# Patient Record
Sex: Female | Born: 1950 | Race: White | Hispanic: No | Marital: Married | Smoking: Never smoker
Health system: Southern US, Community
[De-identification: ages and names within clinical notes are randomized; demographics above are authoritative.]

## PROBLEM LIST (undated history)

## (undated) DIAGNOSIS — H409 Unspecified glaucoma: Secondary | ICD-10-CM

## (undated) DIAGNOSIS — K589 Irritable bowel syndrome without diarrhea: Secondary | ICD-10-CM

## (undated) HISTORY — PX: ABDOMINAL HYSTERECTOMY: SHX81

## (undated) HISTORY — PX: TONSILLECTOMY: SUR1361

---

## 2011-07-06 ENCOUNTER — Ambulatory Visit: Payer: Self-pay | Admitting: Gastroenterology

## 2011-07-26 ENCOUNTER — Ambulatory Visit: Payer: Self-pay | Admitting: Family Medicine

## 2013-10-07 ENCOUNTER — Ambulatory Visit: Payer: Self-pay | Admitting: Internal Medicine

## 2015-05-28 ENCOUNTER — Encounter: Payer: Self-pay | Admitting: Gynecology

## 2015-05-28 ENCOUNTER — Ambulatory Visit
Admission: EM | Admit: 2015-05-28 | Discharge: 2015-05-28 | Disposition: A | Discharge status: Home / Self Care | Payer: BLUE CROSS/BLUE SHIELD | Attending: Family Medicine | Admitting: Family Medicine

## 2015-05-28 DIAGNOSIS — J0110 Acute frontal sinusitis, unspecified: Secondary | ICD-10-CM

## 2015-05-28 DIAGNOSIS — J0100 Acute maxillary sinusitis, unspecified: Secondary | ICD-10-CM

## 2015-05-28 HISTORY — DX: Unspecified glaucoma: H40.9

## 2015-05-28 HISTORY — DX: Irritable bowel syndrome, unspecified: K58.9

## 2015-05-28 MED ORDER — AMOXICILLIN-POT CLAVULANATE 875-125 MG PO TABS: 1.0000 | ORAL_TABLET | Freq: Two times a day (BID) | ORAL | Status: DC

## 2015-05-28 NOTE — ED Provider Notes (Signed)
[facility]  ____________________________________________  Time seen: Approximately 2:53 PM  I have reviewed the triage vital signs and the nursing notes.   HISTORY  Chief Complaint URI    HPI Kristen Robertson is a 64 y.o. female  presents for the complaints of 2 weeks of runny nose, nasal congestion, sinus pressure. Patient reports gradual onset. Reports frequently blowing her nose and producing very thick greenish mucus. Also feels like the sinus pressure radiates into her teeth and both ears. States that she intermittently feels like she has fluid in her ears.  Reports current sinus discomfort is a 5 out of 10 aching and pressure. Denies pain radiation. Denies headache, dizziness, neck pain, chest pain, shortness of breath or fever. Reports continues to eat and drink well. Denies recent antibiotic use.   Past Medical History  Diagnosis Date  . Glaucoma   . IBS (irritable bowel syndrome)     There are no active problems to display for this patient.   Past Surgical History  Procedure Laterality Date  . Tonsillectomy    . Abdominal hysterectomy      Current Outpatient Rx  Name  Route  Sig  Dispense  Refill  . imipramine (TOFRANIL) 50 MG tablet   Oral   Take 50 mg by mouth at bedtime.           Allergies Aspirin  No family history on file.  Social History Social History  Substance Use Topics  . Smoking status: Never Smoker   . Smokeless tobacco: None  . Alcohol Use: Yes    Review of Systems Constitutional: No fever/chills Eyes: No visual changes. ENT: No sore throat. Positive runny nose, nasal congestion, sinus discomfort. Positive ear discomfort. Cardiovascular: Denies chest pain. Respiratory: Denies shortness of breath. Gastrointestinal: No abdominal pain.  No nausea, no vomiting.  No diarrhea.  No constipation. Genitourinary: Negative for dysuria. Musculoskeletal: Negative for back pain. Skin: Negative for rash. Neurological: Negative  for headaches, focal weakness or numbness.  10-point ROS otherwise negative.  ____________________________________________   PHYSICAL EXAM:  VITAL SIGNS: ED Triage Vitals  Enc Vitals Group     BP 05/28/15 1348 136/54 mmHg     Pulse Rate 05/28/15 1348 91     Resp 05/28/15 1348 16     Temp 05/28/15 1348 98.6 F (37 C)     Temp Source 05/28/15 1348 Oral     SpO2 05/28/15 1348 100 %     Weight 05/28/15 1348 147 lb (66.679 kg)     Height 05/28/15 1348 5\' 2"  (1.575 m)     Head Cir --      Peak Flow --      Pain Score 05/28/15 1347 0     Pain Loc --      Pain Edu? --      Excl. in [place]? --     Constitutional: Alert and oriented. Well appearing and in no acute distress. Eyes: Conjunctivae are normal. PERRL. EOMI. Head: Atraumatic. Moderate tenderness to palpation bilateral maxillary and bilateral frontal sinuses. No swelling. No erythema.  Ears: no erythema, normal TMs bilaterally. No exudate or drainage.   Nose: nasal congestion, purulent nasal drainage, nasal turbinate erythema.   Mouth/Throat: Mucous membranes are moist.  Oropharynx non-erythematous. No exudate. Tonsils surgically absent. Neck: No stridor.  No cervical spine tenderness to palpation. Hematological/Lymphatic/Immunilogical: No cervical lymphadenopathy. Cardiovascular: Normal rate, regular rhythm. Grossly normal heart sounds.  Good peripheral circulation. Respiratory: Normal respiratory effort.  No retractions. Lungs CTAB. No wheezes,  rales or rhonchi. Gastrointestinal: Soft and nontender. No distention. Normal Bowel sounds.   Musculoskeletal: No lower or upper extremity tenderness nor edema.  No joint effusions. Bilateral pedal pulses equal and easily palpated.  Neurologic:  Normal speech and language. No gross focal neurologic deficits are appreciated. No gait instability. Skin:  Skin is warm, dry and intact. No rash noted. Psychiatric: Mood and affect are normal. Speech and behavior are  normal.  ____________________________________________   LABS (all labs ordered are listed, but only abnormal results are displayed)  Labs Reviewed - No data to display   INITIAL IMPRESSION / ASSESSMENT AND PLAN / ED COURSE  Pertinent labs & imaging results that were available during my care of the patient were reviewed by me and considered in my medical decision making (see chart for details).  Very well-appearing patient. No acute distress. Presents for the complaints of 2 weeks of runny nose, nasal congestion, sinus pressure. Lungs clear throughout. Will treat sinusitis with oral Augmentin and supportive treatments including rest, fluids, prn otc ibuprofen or tylenol. Follow-up with primary care physician this week as needed.  Discussed follow up with Primary care physician this week. Discussed follow up and return parameters including no resolution or any worsening concerns. Patient verbalized understanding and agreed to plan.   ____________________________________________   FINAL CLINICAL IMPRESSION(S) / ED DIAGNOSES  Final diagnoses:  Acute maxillary sinusitis, recurrence not specified  Acute frontal sinusitis, recurrence not specified       Marylene Land, NP 05/28/15 1513

## 2015-05-28 NOTE — Discharge Instructions (Signed)
Take medication as prescribed. Eat and drink well. Take over the counter tylenol or ibuprofen as needed.   Follow up with your primary care physician this week as needed. Return to Urgent care for new or worsening concerns.   Sinusitis, Adult Sinusitis is redness, soreness, and inflammation of the paranasal sinuses. Paranasal sinuses are air pockets within the bones of your face. They are located beneath your eyes, in the middle of your forehead, and above your eyes. In healthy paranasal sinuses, mucus is able to drain out, and air is able to circulate through them by way of your nose. However, when your paranasal sinuses are inflamed, mucus and air can become trapped. This can allow bacteria and other germs to grow and cause infection. Sinusitis can develop quickly and last only a short time (acute) or continue over a long period (chronic). Sinusitis that lasts for more than 12 weeks is considered chronic. CAUSES Causes of sinusitis include:  Allergies.  Structural abnormalities, such as displacement of the cartilage that separates your nostrils (deviated septum), which can decrease the air flow through your nose and sinuses and affect sinus drainage.  Functional abnormalities, such as when the small hairs (cilia) that line your sinuses and help remove mucus do not work properly or are not present. SIGNS AND SYMPTOMS Symptoms of acute and chronic sinusitis are the same. The primary symptoms are pain and pressure around the affected sinuses. Other symptoms include:  Upper toothache.  Earache.  Headache.  Bad breath.  Decreased sense of smell and taste.  A cough, which worsens when you are lying flat.  Fatigue.  Fever.  Thick drainage from your nose, which often is green and may contain pus (purulent).  Swelling and warmth over the affected sinuses. DIAGNOSIS Your health care provider will perform a physical exam. During your exam, your health care provider may perform any of the  following to help determine if you have acute sinusitis or chronic sinusitis:  Look in your nose for signs of abnormal growths in your nostrils (nasal polyps).  Tap over the affected sinus to check for signs of infection.  View the inside of your sinuses using an imaging device that has a light attached (endoscope). If your health care provider suspects that you have chronic sinusitis, one or more of the following tests may be recommended:  Allergy tests.  Nasal culture. A sample of mucus is taken from your nose, sent to a lab, and screened for bacteria.  Nasal cytology. A sample of mucus is taken from your nose and examined by your health care provider to determine if your sinusitis is related to an allergy. TREATMENT Most cases of acute sinusitis are related to a viral infection and will resolve on their own within 10 days. Sometimes, medicines are prescribed to help relieve symptoms of both acute and chronic sinusitis. These may include pain medicines, decongestants, nasal steroid sprays, or saline sprays. However, for sinusitis related to a bacterial infection, your health care provider will prescribe antibiotic medicines. These are medicines that will help kill the bacteria causing the infection. Rarely, sinusitis is caused by a fungal infection. In these cases, your health care provider will prescribe antifungal medicine. For some cases of chronic sinusitis, surgery is needed. Generally, these are cases in which sinusitis recurs more than 3 times per year, despite other treatments. HOME CARE INSTRUCTIONS  Drink plenty of water. Water helps thin the mucus so your sinuses can drain more easily.  Use a humidifier.  Inhale steam 3-4  times a day (for example, sit in the bathroom with the shower running).  Apply a warm, moist washcloth to your face 3-4 times a day, or as directed by your health care provider.  Use saline nasal sprays to help moisten and clean your sinuses.  Take  medicines only as directed by your health care provider.  If you were prescribed either an antibiotic or antifungal medicine, finish it all even if you start to feel better. SEEK IMMEDIATE MEDICAL CARE IF:  You have increasing pain or severe headaches.  You have nausea, vomiting, or drowsiness.  You have swelling around your face.  You have vision problems.  You have a stiff neck.  You have difficulty breathing.   This information is not intended to replace advice given to you by your health care provider. Make sure you discuss any questions you have with your health care provider.   Document Released: 06/18/2005 Document Revised: 07/09/2014 Document Reviewed: 07/03/2011 Elsevier Interactive Patient Education 2016 Elsevier Inc[employer]  Sinus Rinse WHAT IS A SINUS RINSE? A sinus rinse is a simple home treatment that is used to rinse your sinuses with a sterile mixture of salt and water (saline solution). Sinuses are air-filled spaces in your skull behind the bones of your face and forehead that open into your nasal cavity. You will use the following:  Saline solution.  Neti pot or spray bottle. This releases the saline solution into your nose and through your sinuses. Neti pots and spray bottles can be purchased at [employer], a health food store, or online. WHEN WOULD I DO A SINUS RINSE? A sinus rinse can help to clear mucus, dirt, dust, or pollen from the nasal cavity. You may do a sinus rinse when you have a cold, a virus, nasal allergy symptoms, a sinus infection, or stuffiness in the nose or sinuses. If you are considering a sinus rinse:  Ask your child's health care provider before performing a sinus rinse on your child.  Do not do a sinus rinse if you have had ear or nasal surgery, ear infection, or blocked ears. HOW DO I DO A SINUS RINSE?  Wash your hands.  Disinfect your device according to the directions provided and then dry it.  Use the solution that comes  with your device or one that is sold separately in stores. Follow the mixing directions on the package.  Fill your device with the amount of saline solution as directed by the device instructions.  Stand over a sink and tilt your head sideways over the sink.  Place the spout of the device in your upper nostril (the one closer to the ceiling).  Gently pour or squeeze the saline solution into the nasal cavity. The liquid should drain to the lower nostril if you are not overly congested.  Gently blow your nose. Blowing too hard may cause ear pain.  Repeat in the other nostril.  Clean and rinse your device with clean water and then air-dry it. ARE THERE RISKS OF A SINUS RINSE?  Sinus rinse is generally very safe and effective. However, there are a few risks, which include:   A burning sensation in the sinuses. This may happen if you do not make the saline solution as directed. Make sure to follow all directions when making the saline solution.  Infection from contaminated water. This is rare, but possible.  Nasal irritation.   This information is not intended to replace advice given to you by your health care provider. Make  sure you discuss any questions you have with your health care provider.   Document Released: 01/13/2014 Document Reviewed: 01/13/2014 Elsevier Interactive Patient Education [employer].

## 2015-05-28 NOTE — ED Notes (Signed)
Patient c/o cough / nasal congestion.

## 2016-03-15 ENCOUNTER — Ambulatory Visit (INDEPENDENT_AMBULATORY_CARE_PROVIDER_SITE_OTHER): Payer: BLUE CROSS/BLUE SHIELD

## 2016-03-15 ENCOUNTER — Encounter: Payer: Self-pay | Admitting: Emergency Medicine

## 2016-03-15 ENCOUNTER — Ambulatory Visit
Admission: EM | Admit: 2016-03-15 | Discharge: 2016-03-15 | Disposition: A | Discharge status: Home / Self Care | Payer: BLUE CROSS/BLUE SHIELD | Attending: Family Medicine | Admitting: Family Medicine

## 2016-03-15 DIAGNOSIS — M25511 Pain in right shoulder: Secondary | ICD-10-CM

## 2016-03-15 DIAGNOSIS — M7521 Bicipital tendinitis, right shoulder: Secondary | ICD-10-CM

## 2016-03-15 MED ORDER — TRAMADOL HCL 50 MG PO TABS
50.0000 mg | ORAL_TABLET | Freq: Four times a day (QID) | ORAL | 0 refills | Status: AC | PRN
Start: 2016-03-15 — End: ?

## 2016-03-15 NOTE — ED Triage Notes (Signed)
Patient c/o pain on the right side of her neck and in her right arm that started several weeks ago.  Patient denies chest pain or SOB.

## 2016-03-15 NOTE — ED Provider Notes (Signed)
[facility]    CSN: [id number] Arrival date & time: 03/15/16  [id number]  First Provider Contact:  None       History   Chief Complaint Chief Complaint  Patient presents with  . Neck Pain  . Arm Pain    HPI Kristen Robertson is a 65 y.o. female.   Patient is here because of pain in the right shoulder. Unfortunately she cannot really delineate when the right shoulder pain started we know this been going on for least a month. She still driving with the right arm but she does admit that they use for right arm is very limited. She denies any injury to the right arm or shoulder. One time she was worried about her heart but realizes that there is nothing to do with the left side and that she's had no chest pain or shortness of breath. She does state that she has an allergy to aspirin because that allergies to aspirin she doesn't take any type of ibuprofen and other anti-inflammatory. She states that her doctor when she lived [place] told her not to take any of those medicines because for allergies to aspirin Kristen Robertson she does. She has just taken Tylenol for the pain which is helped to some degree. She notices difficulty lifting the arm above her head so she is concerned and came in to be seen. She does not have a primary care physician in the area since moving from [place] states she just comes here when she is cold or congestion. She does have a history of elevated cholesterol which was done by her husband's wellness plan but she's not had any medication for that this time. She also has a history of glaucoma and IBS. Previous surgeries hysterectomy and tonsillectomy and she does not smoke. No pertinent family medical history pertaining to her relevant to this visit   She did have some neck pain states that, this and was felt that more especially neck pain by her right lower jaw which is gotten better. She was concerned whether the right jaw pain caused her to have the right shoulder  pain   The history is provided by the patient. The history is limited by a language barrier. No language interpreter was used.  Neck Pain  Pain location:  R side Quality:  Aching Pain radiates to:  Does not radiate Pain severity:  Mild Progression:  Improving Context: not fall, not jumping from heights, not lifting a heavy object, not MCA, not MVC, not pedestrian accident and not recent injury   Worsened by:  Nothing Ineffective treatments:  None tried Risk factors: no hx of head and neck radiation, no hx of osteoporosis, no hx of spinal trauma, no recent epidural, no recent head injury and no recurrent falls   Arm Pain   Shoulder Pain  Location:  Shoulder Pain details:    Quality:  Aching and shooting   Radiates to:  R shoulder   Severity:  Moderate   Onset quality:  Sudden   Timing:  Constant Handedness:  Right-handed Dislocation: no   Foreign body present:  No foreign bodies Prior injury to area:  Yes Relieved by:  Arthritis medications Worsened by:  Nothing Associated symptoms: neck pain     Past Medical History:  Diagnosis Date  . Glaucoma   . IBS (irritable bowel syndrome)     There are no active problems to display for this patient.   Past Surgical History:  Procedure Laterality Date  . ABDOMINAL  HYSTERECTOMY    . TONSILLECTOMY      OB History    No data available       Home Medications    Prior to Admission medications   Medication Sig Start Date End Date Taking? Authorizing Provider  imipramine (TOFRANIL) 50 MG tablet Take 50 mg by mouth at bedtime.    Historical Provider, MD  traMADol (ULTRAM) 50 MG tablet Take 1 tablet (50 mg total) by mouth every 6 (six) hours as needed. 03/15/16   Frederich Cha, MD    Family History History reviewed. No pertinent family history.  Social History Social History  Substance Use Topics  . Smoking status: Never Smoker  . Smokeless tobacco: Never Used  . Alcohol use Yes     Allergies   Aspirin   Review  of Systems Review of Systems  Musculoskeletal: Positive for myalgias and neck pain.  All other systems reviewed and are negative.    Physical Exam Triage Vital Signs ED Triage Vitals  Enc Vitals Group     BP 03/15/16 0821 (!) 141/63     Pulse Rate 03/15/16 0821 78     Resp 03/15/16 0821 16     Temp 03/15/16 0821 97.4 F (36.3 C)     Temp Source 03/15/16 0821 Tympanic     SpO2 03/15/16 0821 100 %     Weight 03/15/16 0822 142 lb (64.4 kg)     Height 03/15/16 0822 5\' 2"  (1.575 m)     Head Circumference --      Peak Flow --      Pain Score 03/15/16 0824 3     Pain Loc --      Pain Edu? --      Excl. in [place]? --    No data found.   Updated Vital Signs BP (!) 141/63 (BP Location: Left Arm)   Pulse 78   Temp 97.4 F (36.3 C) (Tympanic)   Resp 16   Ht 5\' 2"  (1.575 m)   Wt 142 lb (64.4 kg)   SpO2 100%   BMI 25.97 kg/m   Visual Acuity Right Eye Distance:   Left Eye Distance:   Bilateral Distance:    Right Eye Near:   Left Eye Near:    Bilateral Near:     Physical Exam  Constitutional: She is oriented to person, place, and time. She appears well-developed and well-nourished. No distress.  HENT:  Head: Normocephalic and atraumatic.  Eyes: Pupils are equal, round, and reactive to light.  Neck: Normal range of motion. Neck supple.  Pulmonary/Chest: Effort normal.  Musculoskeletal: She exhibits tenderness.       Right shoulder: She exhibits decreased range of motion, tenderness, bony tenderness and pain. She exhibits no deformity, no spasm and normal strength.       Arms: Patient has tenderness over her right by some tendon consistent with a biceps tendinitis mother concern is she is able to raise the arm above her head slowly she is also able to resist me with the empty beer can test maneuver however she has limited range of motion of the shoulder moving her arm back behind her back indicating some shoulder lotion problem or early freezing of the shoulder    Lymphadenopathy:    She has no cervical adenopathy.  Neurological: She is alert and oriented to person, place, and time.  Skin: Skin is warm. She is diaphoretic. No erythema.  Psychiatric: She has a normal mood and affect.  Vitals reviewed.  UC Treatments / Results  Labs (all labs ordered are listed, but only abnormal results are displayed) Labs Reviewed - No data to display  EKG  EKG Interpretation None       Radiology Dg Shoulder Right  Result Date: 03/15/2016 CLINICAL DATA:  Right shoulder pain for 6 months. No reported injury. EXAM: RIGHT SHOULDER - 2+ VIEW COMPARISON:  None. FINDINGS: There is no evidence of fracture or dislocation. There is no evidence of arthropathy or other focal bone abnormality. Soft tissues are unremarkable. IMPRESSION: Negative. Electronically Signed   By: Ilona Sorrel M.D.   On: 03/15/2016 09:15    Procedures Procedures (including critical care time)  Medications Ordered in UC Medications - No data to display   Initial Impression / Assessment and Plan / UC Course  I have reviewed the triage vital signs and the nursing notes.  Pertinent labs & imaging results that were available during my care of the patient were reviewed by me and considered in my medical decision making (see chart for details).  Clinical Course    As expected showed x-rays negative without trauma V unlikely to have a fracture dislocation. Feel this is consistent with diagnosis of bicipital tendinitis and early shoulder freezing. Normally we'll place her on anti-inflammatories but because of her fear of anti-inflammatories of any type we'll hold off on that. We'll recommend referral to orthopedic for evaluation and possible steroid injection in the biceps tendon to help relieve [place] pain also giving her instructions verbally about using exercise band attached the door to try to keep the shoulder from freezing  Final Clinical Impressions(s) / UC Diagnoses   Final  diagnoses:  Biceps tendonitis on right  Right shoulder pain   As patient being discharged since she can't take anti-inflammatories and she states that she wants something stronger than the Tylenol she's been taking a prescription of tramadol was given to her until she can see the orthopedic and had the shoulder injected New Prescriptions New Prescriptions   TRAMADOL (ULTRAM) 50 MG TABLET    Take 1 tablet (50 mg total) by mouth every 6 (six) hours as needed.     Frederich Cha, MD 03/15/16 [id number]

## 2016-06-12 DIAGNOSIS — H401132 Primary open-angle glaucoma, bilateral, moderate stage: Secondary | ICD-10-CM | POA: Diagnosis not present

## 2016-06-12 DIAGNOSIS — H5213 Myopia, bilateral: Secondary | ICD-10-CM | POA: Diagnosis not present

## 2016-06-15 ENCOUNTER — Ambulatory Visit
Admission: EM | Admit: 2016-06-15 | Discharge: 2016-06-15 | Disposition: A | Discharge status: Home / Self Care | Payer: Medicare HMO | Attending: Family Medicine | Admitting: Family Medicine

## 2016-06-15 ENCOUNTER — Encounter: Payer: Self-pay | Admitting: Emergency Medicine

## 2016-06-15 DIAGNOSIS — J0100 Acute maxillary sinusitis, unspecified: Secondary | ICD-10-CM

## 2016-06-15 MED ORDER — BENZONATATE 100 MG PO CAPS: 100.0000 mg | ORAL_CAPSULE | Freq: Three times a day (TID) | ORAL | 0 refills | Status: AC | PRN

## 2016-06-15 MED ORDER — AMOXICILLIN-POT CLAVULANATE 875-125 MG PO TABS: 1.0000 | ORAL_TABLET | Freq: Two times a day (BID) | ORAL | 0 refills | Status: DC

## 2016-06-15 NOTE — Discharge Instructions (Signed)
Take medication as prescribed. Rest. Drink plenty of fluids. Monitor blood pressure at home as discussed.   Follow up with your primary care physician this week as needed. Return to Urgent care for new or worsening concerns.

## 2016-06-15 NOTE — ED Triage Notes (Signed)
Patient c/o sinus pain and pressure for the past 3 weeks.  Patient denies fevers.

## 2016-06-15 NOTE — ED Provider Notes (Signed)
[facility] ____________________________________________  Time seen: Approximately 10:18 AM  I have reviewed the triage vital signs and the nursing notes.   HISTORY  Chief Complaint Facial Pain   HPI Kristen Robertson is a 65 y.o. female presents with complaints of 3 weeks of runny nose, nasal congestion and sinus drainage. Patient reports sinus pressure describes as fullness in her cheeks. Patient reports taking home Zyrtec and over-the-counter congestion medications help but no resolution. States some cough, primarily at night with associated postnasal drainage. States initially felt like it started out as her seasonal allergies but progressed. Denies and sick contacts. Patient poor she is also been using nasal saline drops and using Nettie pot with return of large amount of mucus. States plan her nose frequently and thick mucus present. States occasional blood-tinged mucus when blowing her nose, denies any other nasal bleeding or any other abnormal bleeding.  Denies chest pain, shortness of breath, abdominal pain, dysuria, neck pain, back pain, extremity pain or extremity swelling. Denies recent sickness or recent antibiotic use.   No LMP recorded. Patient has had a hysterectomy.    Past Medical History:  Diagnosis Date  . Glaucoma   . IBS (irritable bowel syndrome)     There are no active problems to display for this patient.   Past Surgical History:  Procedure Laterality Date  . ABDOMINAL HYSTERECTOMY    . TONSILLECTOMY      No current facility-administered medications for this encounter.   Current Outpatient Prescriptions:  .  amoxicillin-clavulanate (AUGMENTIN) 875-125 MG tablet, Take 1 tablet by mouth every 12 (twelve) hours., Disp: 20 tablet, Rfl: 0 .  benzonatate (TESSALON PERLES) 100 MG capsule, Take 1 capsule (100 mg total) by mouth 3 (three) times daily as needed for cough., Disp: 15 capsule, Rfl: 0 .  imipramine (TOFRANIL) 50 MG tablet, Take 50 mg  by mouth at bedtime., Disp: , Rfl:  .  traMADol (ULTRAM) 50 MG tablet, Take 1 tablet (50 mg total) by mouth every 6 (six) hours as needed., Disp: 15 tablet, Rfl: 0  Allergies Aspirin  History reviewed. No pertinent family history.  Social History Social History  Substance Use Topics  . Smoking status: Never Smoker  . Smokeless tobacco: Never Used  . Alcohol use Yes    Review of Systems Constitutional: No fever/chills Eyes: No visual changes. ENT: No sore throat. As above.  Cardiovascular: Denies chest pain. Respiratory: Denies shortness of breath. Gastrointestinal: No abdominal pain.  No nausea, no vomiting.  No diarrhea.  No constipation. Genitourinary: Negative for dysuria. Musculoskeletal: Negative for back pain. Skin: Negative for rash. Neurological: Negative for headaches, focal weakness or numbness.  10-point ROS otherwise negative.  ____________________________________________   PHYSICAL EXAM:  VITAL SIGNS: ED Triage Vitals  Enc Vitals Group     BP 06/15/16 1009 (!) 162/89     Pulse Rate 06/15/16 1009 99     Resp 06/15/16 1009 16     Temp 06/15/16 1009 98.7 F (37.1 C)     Temp Source 06/15/16 1009 Oral     SpO2 06/15/16 1009 100 %     Weight 06/15/16 1008 150 lb (68 kg)     Height 06/15/16 1008 5\' 2"  (1.575 m)     Head Circumference --      Peak Flow --      Pain Score 06/15/16 1009 4     Pain Loc --      Pain Edu? --      Excl. in [place]? --  Today's Vitals   06/15/16 1008 06/15/16 1009 06/15/16 1036 06/15/16 1037  BP:  (!) 162/89  (!) 156/63  Pulse:  99  97  Resp:  16    Temp:  98.7 F (37.1 C)    TempSrc:  Oral    SpO2:  100%    Weight: 150 lb (68 kg)     Height: 5\' 2"  (1.575 m)     PainSc:  4  4     Constitutional: Alert and oriented. Well appearing and in no acute distress. Eyes: Conjunctivae are normal. PERRL. EOMI. Head: Atraumatic.Mild to moderate tenderness to palpation bilateral maxillary sinuses, no frontal sinus tenderness to  palpation. No swelling. No erythema.   Ears: no erythema, normal TMs bilaterally.   Nose: nasal congestion with bilateral nasal turbinate erythema and edema. Left nare minimal dried blood present, no other dried blood noted, no active bleeding.  Mouth/Throat: Mucous membranes are moist.  Oropharynx non-erythematous.No tonsillar swelling or exudate.  Neck: No stridor.  No cervical spine tenderness to palpation. Hematological/Lymphatic/Immunilogical: No cervical lymphadenopathy. Cardiovascular: Normal rate, regular rhythm. Grossly normal heart sounds.  Good peripheral circulation. Respiratory: Normal respiratory effort.  No retractions. Lungs CTAB. No wheezes, rales or rhonchi. Good air movement.  Gastrointestinal: Soft and nontender. No distention. Normal Bowel sounds. No CVA tenderness. Musculoskeletal: No lower or upper extremity tenderness nor edema.   No cervical, thoracic or lumbar tenderness to palpation.  Neurologic:  Normal speech and language. No gait instability. Skin:  Skin is warm, dry and intact. No rash noted. Psychiatric: Mood and affect are normal. Speech and behavior are normal.  ___________________________________________   LABS (all labs ordered are listed, but only abnormal results are displayed)  Labs Reviewed - No data to display  PROCEDURES Procedures   INITIAL IMPRESSION / ASSESSMENT AND PLAN / ED COURSE  Pertinent labs & imaging results that were available during my care of the patient were reviewed by me and considered in my medical decision making (see chart for details).  Well-appearing patient. No acute distress. 3 weeks of nasal congestion and sinus pressure. Suspect maxillary sinusitis. Encouraged supportive care. Will treat patient with oral Augmentin and when necessary Tessalon Perles. Encouraged rest, fluids and PCP follow-up as needed.  Discussed in detail with patient regarding monitoring blood pressure at home, discuss stopping over-the-counter  multiple agent cough and congestion medications as that could contribute. Patient states she will monitor blood pressure at home and follow up with primary care physician this week.  Discussed follow up with Primary care physician this week. Discussed follow up and return parameters including no resolution or any worsening concerns. Patient verbalized understanding and agreed to plan.   ____________________________________________   FINAL CLINICAL IMPRESSION(S) / ED DIAGNOSES  Final diagnoses:  Acute maxillary sinusitis, recurrence not specified     Discharge Medication List as of 06/15/2016 10:33 AM    START taking these medications   Details  amoxicillin-clavulanate (AUGMENTIN) 875-125 MG tablet Take 1 tablet by mouth every 12 (twelve) hours., Starting Fri 06/15/2016, Normal    benzonatate (TESSALON PERLES) 100 MG capsule Take 1 capsule (100 mg total) by mouth 3 (three) times daily as needed for cough., Starting Fri 06/15/2016, Normal        Note: This dictation was prepared with Dragon dictation along with smaller phrase technology. Any transcriptional errors that result from this process are unintentional.    Clinical Course       Marylene Land, NP 06/15/16 1058    Marylene Land, NP 06/15/16  1059  

## 2016-07-13 DIAGNOSIS — H409 Unspecified glaucoma: Secondary | ICD-10-CM | POA: Diagnosis not present

## 2016-07-13 DIAGNOSIS — J309 Allergic rhinitis, unspecified: Secondary | ICD-10-CM | POA: Diagnosis not present

## 2016-07-13 DIAGNOSIS — Z0000 Encounter for general adult medical examination without abnormal findings: Secondary | ICD-10-CM | POA: Diagnosis not present

## 2016-07-13 DIAGNOSIS — Z79899 Other long term (current) drug therapy: Secondary | ICD-10-CM | POA: Diagnosis not present

## 2016-07-13 DIAGNOSIS — Z6827 Body mass index (BMI) 27.0-27.9, adult: Secondary | ICD-10-CM | POA: Diagnosis not present

## 2016-07-13 DIAGNOSIS — K580 Irritable bowel syndrome with diarrhea: Secondary | ICD-10-CM | POA: Diagnosis not present

## 2016-08-03 DIAGNOSIS — H26492 Other secondary cataract, left eye: Secondary | ICD-10-CM | POA: Diagnosis not present

## 2016-08-03 DIAGNOSIS — H401122 Primary open-angle glaucoma, left eye, moderate stage: Secondary | ICD-10-CM | POA: Diagnosis not present

## 2016-08-03 DIAGNOSIS — Z9889 Other specified postprocedural states: Secondary | ICD-10-CM | POA: Diagnosis not present

## 2016-08-03 DIAGNOSIS — H4089 Other specified glaucoma: Secondary | ICD-10-CM | POA: Diagnosis not present

## 2016-08-03 DIAGNOSIS — H35372 Puckering of macula, left eye: Secondary | ICD-10-CM | POA: Diagnosis not present

## 2016-08-14 DIAGNOSIS — Z23 Encounter for immunization: Secondary | ICD-10-CM | POA: Diagnosis not present

## 2016-08-14 DIAGNOSIS — Z1231 Encounter for screening mammogram for malignant neoplasm of breast: Secondary | ICD-10-CM | POA: Diagnosis not present

## 2016-08-14 DIAGNOSIS — R6889 Other general symptoms and signs: Secondary | ICD-10-CM | POA: Diagnosis not present

## 2016-08-14 DIAGNOSIS — Z6827 Body mass index (BMI) 27.0-27.9, adult: Secondary | ICD-10-CM | POA: Diagnosis not present

## 2016-08-14 DIAGNOSIS — Z0000 Encounter for general adult medical examination without abnormal findings: Secondary | ICD-10-CM | POA: Diagnosis not present

## 2016-08-23 DIAGNOSIS — M85852 Other specified disorders of bone density and structure, left thigh: Secondary | ICD-10-CM | POA: Diagnosis not present

## 2016-08-23 DIAGNOSIS — M8589 Other specified disorders of bone density and structure, multiple sites: Secondary | ICD-10-CM | POA: Diagnosis not present

## 2016-08-23 DIAGNOSIS — M8588 Other specified disorders of bone density and structure, other site: Secondary | ICD-10-CM | POA: Diagnosis not present

## 2016-08-23 DIAGNOSIS — Z1231 Encounter for screening mammogram for malignant neoplasm of breast: Secondary | ICD-10-CM | POA: Diagnosis not present

## 2016-08-23 DIAGNOSIS — Z780 Asymptomatic menopausal state: Secondary | ICD-10-CM | POA: Diagnosis not present

## 2016-11-05 DIAGNOSIS — B9689 Other specified bacterial agents as the cause of diseases classified elsewhere: Secondary | ICD-10-CM | POA: Diagnosis not present

## 2016-11-05 DIAGNOSIS — J019 Acute sinusitis, unspecified: Secondary | ICD-10-CM | POA: Diagnosis not present

## 2016-11-05 DIAGNOSIS — J302 Other seasonal allergic rhinitis: Secondary | ICD-10-CM | POA: Diagnosis not present

## 2016-11-05 DIAGNOSIS — Z6827 Body mass index (BMI) 27.0-27.9, adult: Secondary | ICD-10-CM | POA: Diagnosis not present

## 2016-11-12 DIAGNOSIS — J329 Chronic sinusitis, unspecified: Secondary | ICD-10-CM | POA: Diagnosis not present

## 2016-11-12 DIAGNOSIS — B009 Herpesviral infection, unspecified: Secondary | ICD-10-CM | POA: Diagnosis not present

## 2016-11-12 DIAGNOSIS — K589 Irritable bowel syndrome without diarrhea: Secondary | ICD-10-CM | POA: Diagnosis not present

## 2016-11-12 DIAGNOSIS — Z1211 Encounter for screening for malignant neoplasm of colon: Secondary | ICD-10-CM | POA: Diagnosis not present

## 2016-11-12 DIAGNOSIS — E781 Pure hyperglyceridemia: Secondary | ICD-10-CM | POA: Diagnosis not present

## 2016-11-12 DIAGNOSIS — Z6827 Body mass index (BMI) 27.0-27.9, adult: Secondary | ICD-10-CM | POA: Diagnosis not present

## 2016-11-12 DIAGNOSIS — M8580 Other specified disorders of bone density and structure, unspecified site: Secondary | ICD-10-CM | POA: Diagnosis not present

## 2016-11-21 DIAGNOSIS — E781 Pure hyperglyceridemia: Secondary | ICD-10-CM | POA: Diagnosis not present

## 2016-12-05 DIAGNOSIS — E781 Pure hyperglyceridemia: Secondary | ICD-10-CM | POA: Diagnosis not present

## 2016-12-17 DIAGNOSIS — R159 Full incontinence of feces: Secondary | ICD-10-CM | POA: Diagnosis not present

## 2016-12-17 DIAGNOSIS — R140 Abdominal distension (gaseous): Secondary | ICD-10-CM | POA: Diagnosis not present

## 2016-12-17 DIAGNOSIS — K589 Irritable bowel syndrome without diarrhea: Secondary | ICD-10-CM | POA: Diagnosis not present

## 2016-12-17 DIAGNOSIS — Z6828 Body mass index (BMI) 28.0-28.9, adult: Secondary | ICD-10-CM | POA: Diagnosis not present

## 2016-12-27 DIAGNOSIS — H16142 Punctate keratitis, left eye: Secondary | ICD-10-CM | POA: Diagnosis not present

## 2017-01-03 DIAGNOSIS — R197 Diarrhea, unspecified: Secondary | ICD-10-CM | POA: Diagnosis not present

## 2017-01-03 DIAGNOSIS — R159 Full incontinence of feces: Secondary | ICD-10-CM | POA: Diagnosis not present

## 2017-01-03 DIAGNOSIS — R140 Abdominal distension (gaseous): Secondary | ICD-10-CM | POA: Diagnosis not present

## 2017-01-04 DIAGNOSIS — Z961 Presence of intraocular lens: Secondary | ICD-10-CM | POA: Diagnosis not present

## 2017-01-04 DIAGNOSIS — H25091 Other age-related incipient cataract, right eye: Secondary | ICD-10-CM | POA: Diagnosis not present

## 2017-01-07 DIAGNOSIS — D125 Benign neoplasm of sigmoid colon: Secondary | ICD-10-CM | POA: Diagnosis not present

## 2017-01-07 DIAGNOSIS — D369 Benign neoplasm, unspecified site: Secondary | ICD-10-CM | POA: Diagnosis not present

## 2017-01-07 DIAGNOSIS — K529 Noninfective gastroenteritis and colitis, unspecified: Secondary | ICD-10-CM | POA: Diagnosis not present

## 2017-01-07 DIAGNOSIS — D120 Benign neoplasm of cecum: Secondary | ICD-10-CM | POA: Diagnosis not present

## 2017-01-07 DIAGNOSIS — K648 Other hemorrhoids: Secondary | ICD-10-CM | POA: Diagnosis not present

## 2017-01-07 DIAGNOSIS — D126 Benign neoplasm of colon, unspecified: Secondary | ICD-10-CM | POA: Diagnosis not present

## 2017-01-07 DIAGNOSIS — Z79899 Other long term (current) drug therapy: Secondary | ICD-10-CM | POA: Diagnosis not present

## 2017-01-08 DIAGNOSIS — H2511 Age-related nuclear cataract, right eye: Secondary | ICD-10-CM | POA: Diagnosis not present

## 2017-01-08 DIAGNOSIS — H26492 Other secondary cataract, left eye: Secondary | ICD-10-CM | POA: Diagnosis not present

## 2017-01-08 DIAGNOSIS — H401131 Primary open-angle glaucoma, bilateral, mild stage: Secondary | ICD-10-CM | POA: Diagnosis not present

## 2017-04-15 DIAGNOSIS — R150 Incomplete defecation: Secondary | ICD-10-CM | POA: Diagnosis not present

## 2017-04-23 DIAGNOSIS — M25511 Pain in right shoulder: Secondary | ICD-10-CM | POA: Diagnosis not present

## 2017-04-23 DIAGNOSIS — Z23 Encounter for immunization: Secondary | ICD-10-CM | POA: Diagnosis not present

## 2017-04-23 DIAGNOSIS — E781 Pure hyperglyceridemia: Secondary | ICD-10-CM | POA: Diagnosis not present

## 2017-04-23 DIAGNOSIS — Z6827 Body mass index (BMI) 27.0-27.9, adult: Secondary | ICD-10-CM | POA: Diagnosis not present

## 2017-04-25 DIAGNOSIS — Z23 Encounter for immunization: Secondary | ICD-10-CM | POA: Diagnosis not present

## 2017-05-06 DIAGNOSIS — E781 Pure hyperglyceridemia: Secondary | ICD-10-CM | POA: Diagnosis not present

## 2017-05-10 DIAGNOSIS — M7532 Calcific tendinitis of left shoulder: Secondary | ICD-10-CM | POA: Diagnosis not present

## 2017-05-10 DIAGNOSIS — M7582 Other shoulder lesions, left shoulder: Secondary | ICD-10-CM | POA: Diagnosis not present

## 2017-05-10 DIAGNOSIS — M19012 Primary osteoarthritis, left shoulder: Secondary | ICD-10-CM | POA: Diagnosis not present

## 2017-05-20 DIAGNOSIS — H401131 Primary open-angle glaucoma, bilateral, mild stage: Secondary | ICD-10-CM | POA: Diagnosis not present

## 2017-05-20 DIAGNOSIS — H2511 Age-related nuclear cataract, right eye: Secondary | ICD-10-CM | POA: Diagnosis not present

## 2019-07-31 ENCOUNTER — Ambulatory Visit: Payer: Medicare HMO

## 2019-08-08 ENCOUNTER — Ambulatory Visit: Payer: Medicare HMO

## 2019-08-21 ENCOUNTER — Ambulatory Visit: Payer: Medicare HMO

## 2020-03-30 ENCOUNTER — Emergency Department: Payer: Medicare HMO

## 2020-03-30 ENCOUNTER — Emergency Department
Admission: EM | Admit: 2020-03-30 | Discharge: 2020-03-30 | Disposition: A | Discharge status: Home / Self Care | Payer: Medicare HMO | Attending: Student in an Organized Health Care Education/Training Program | Admitting: Student in an Organized Health Care Education/Training Program

## 2020-03-30 ENCOUNTER — Other Ambulatory Visit: Payer: Self-pay

## 2020-03-30 DIAGNOSIS — R42 Dizziness and giddiness: Secondary | ICD-10-CM | POA: Diagnosis not present

## 2020-03-30 DIAGNOSIS — Z5321 Procedure and treatment not carried out due to patient leaving prior to being seen by health care provider: Secondary | ICD-10-CM | POA: Diagnosis not present

## 2020-03-30 DIAGNOSIS — R197 Diarrhea, unspecified: Secondary | ICD-10-CM | POA: Insufficient documentation

## 2020-03-30 DIAGNOSIS — R112 Nausea with vomiting, unspecified: Secondary | ICD-10-CM | POA: Insufficient documentation

## 2020-03-30 LAB — CBC
HCT: 42.3 % (ref 36.0–46.0)
Hemoglobin: 14.1 g/dL (ref 12.0–15.0)
MCH: 31.7 pg (ref 26.0–34.0)
MCHC: 33.3 g/dL (ref 30.0–36.0)
MCV: 95.1 fL (ref 80.0–100.0)
Platelets: 195 10*3/uL (ref 150–400)
RBC: 4.45 MIL/uL (ref 3.87–5.11)
RDW: 12.1 % (ref 11.5–15.5)
WBC: 7.5 10*3/uL (ref 4.0–10.5)
nRBC: 0 % (ref 0.0–0.2)

## 2020-03-30 LAB — URINALYSIS, COMPLETE (UACMP) WITH MICROSCOPIC
Bacteria, UA: NONE SEEN
Bilirubin Urine: NEGATIVE
Glucose, UA: 50 mg/dL — AB
Hgb urine dipstick: NEGATIVE
Ketones, ur: 5 mg/dL — AB
Leukocytes,Ua: NEGATIVE
Nitrite: NEGATIVE
Protein, ur: NEGATIVE mg/dL
Specific Gravity, Urine: 1.013 (ref 1.005–1.030)
pH: 5 (ref 5.0–8.0)

## 2020-03-30 LAB — BASIC METABOLIC PANEL
Anion gap: 8 (ref 5–15)
BUN: 15 mg/dL (ref 8–23)
CO2: 23 mmol/L (ref 22–32)
Calcium: 8.7 mg/dL — ABNORMAL LOW (ref 8.9–10.3)
Chloride: 107 mmol/L (ref 98–111)
Creatinine, Ser: 0.71 mg/dL (ref 0.44–1.00)
GFR calc Af Amer: >60 mL/min (ref >60)
GFR calc non Af Amer: >60 mL/min (ref >60)
Glucose, Bld: 172 mg/dL — ABNORMAL HIGH (ref 70–99)
Potassium: 4.4 mmol/L (ref 3.5–5.1)
Sodium: 138 mmol/L (ref 135–145)

## 2020-03-30 MED ORDER — MECLIZINE HCL 25 MG PO TABS
50.0000 mg | ORAL_TABLET | Freq: Once | ORAL | Status: AC
  Administered 2020-03-30: 50 mg via ORAL
  Filled 2020-03-30: qty 2

## 2020-03-30 NOTE — ED Triage Notes (Signed)
See first nurse note for more info. Pt states she does having a HA with the dizziness. Denies any visual changes , denies hx of vertigo.

## 2020-03-30 NOTE — ED Triage Notes (Signed)
Pt in via EMS from home with c/o dizziness this am with NVD. Took dramamine with no relief. Pt reports pressure behind eyes and dizziness. 151/69, pt was given 4mg  zofran and 223ml bolus, #18 g to right [facility]

## 2020-03-30 NOTE — ED Notes (Signed)
Pt left with iv in her arm from [place].  Pt called and returned to have iv removed.

## 2020-12-08 ENCOUNTER — Encounter: Payer: Self-pay | Admitting: Physician Assistant

## 2020-12-08 ENCOUNTER — Telehealth: Payer: Medicare HMO | Admitting: Physician Assistant

## 2020-12-08 DIAGNOSIS — H66001 Acute suppurative otitis media without spontaneous rupture of ear drum, right ear: Secondary | ICD-10-CM | POA: Diagnosis not present

## 2020-12-08 DIAGNOSIS — J0140 Acute pansinusitis, unspecified: Secondary | ICD-10-CM | POA: Diagnosis not present

## 2020-12-08 MED ORDER — AMOXICILLIN-POT CLAVULANATE 875-125 MG PO TABS: 1.0000 | ORAL_TABLET | Freq: Two times a day (BID) | ORAL | 0 refills | Status: AC

## 2020-12-08 NOTE — Patient Instructions (Signed)
Otitis Media, Adult  Otitis media occurs when there is inflammation and fluid in the middle ear space with signs and symptoms of an acute infection. The middle ear is a part of the ear that contains bones for hearing as well as air that helps send sounds to the brain. When infected fluid builds up in this space, it causes pressure and results in symptoms of acute otitis media. The eustachian tube connects the middle ear to the back of the nose (nasopharynx) and normally allows air into the middle ear space. If the eustachian tube becomes blocked, fluid can build up and become infected. What are the causes? This condition is caused by a blockage in the eustachian tube. This can be caused by an object like mucus, or by swelling (edema) of the tube. Problems that can cause a blockage include: A cold or other upper respiratory infection. Allergies. An irritant, such as tobacco smoke. Enlarged adenoids. The adenoids are areas of soft tissue located high in the back of the throat, behind the nose and the roof of the mouth. They are part of the body's defense system (immune system). A mass in the nasopharynx. Damage to the ear caused by pressure changes (barotrauma). What are the signs or symptoms? Symptoms of this condition include: Ear pain. Fever. Decreased hearing. Tiredness (lethargy). Fluid leaking from the ear, if the eardrum is ruptured or has burst. Ringing in the ear. How is this diagnosed? This condition is diagnosed with a physical exam. During the exam, your health care provider will use an instrument called an otoscope to look in your ear and check for redness, swelling, and fluid. He or she will also ask about your symptoms. Your health care provider may also order tests, such as: A pneumatic otoscopy. This is a test to check the movement of the eardrum. It is done by squeezing a small amount of air into the ear. A tympanogram is a test that shows how well the eardrum moves in response  to air pressure in the ear canal. It provides a graph for your health care provider to review.   How is this treated? This condition can go away on its own within 3-5 days. But if the condition is caused by a bacterial infection and does not go away on its own, or if it keeps coming back, your health care provider may: Prescribe antibiotic medicine to treat the infection. Prescribe or recommend medicines to control pain. Follow these instructions at home: Take over-the-counter and prescription medicines only as told by your health care provider. If you were prescribed an antibiotic medicine, take it as told by your health care provider. Do not stop taking the antibiotic even if you start to feel better. Keep all follow-up visits as told by your health care provider. This is important. Contact a health care provider if: You have bleeding from your nose. There is a lump on your neck. You are not feeling better in 5 days. You feel worse instead of better. Get help right away if: You have severe pain that is not controlled with medicine. You have swelling, redness, or pain around your ear. You have stiffness in your neck. A part of your face is not moving (paralyzed). The bone behind your ear (mastoid) is tender when you touch it. You develop a severe headache. Summary Otitis media is redness, soreness, and swelling of the middle ear, usually resulting in pain. This condition can go away on its own within 3-5 days. If the problem  does not go away in 3-5 days, your health care provider may prescribe or recommend medicines to treat the infection or your symptoms. If you were prescribed an antibiotic medicine, take it as told by your health care provider. Follow all instructions you were given by your health care provider. This information is not intended to replace advice given to you by your health care provider. Make sure you discuss any questions you have with your health care  provider. Document Revised: 05/21/2019 Document Reviewed: 05/21/2019 Elsevier Patient Education  2021 [place]. Sinusitis, Adult Sinusitis is inflammation of your sinuses. Sinuses are hollow spaces in the bones around your face. Your sinuses are located: Around your eyes. In the middle of your forehead. Behind your nose. In your cheekbones. Mucus normally drains out of your sinuses. When your nasal tissues become inflamed or swollen, mucus can become trapped or blocked. This allows bacteria, viruses, and fungi to grow, which leads to infection. Most infections of the sinuses are caused by a virus. Sinusitis can develop quickly. It can last for up to 4 weeks (acute) or for more than 12 weeks (chronic). Sinusitis often develops after a cold. What are the causes? This condition is caused by anything that creates swelling in the sinuses or stops mucus from draining. This includes: Allergies. Asthma. Infection from bacteria or viruses. Deformities or blockages in your nose or sinuses. Abnormal growths in the nose (nasal polyps). Pollutants, such as chemicals or irritants in the air. Infection from fungi (rare). What increases the risk? You are more likely to develop this condition if you: Have a weak body defense system (immune system). Do a lot of swimming or diving. Overuse nasal sprays. Smoke. What are the signs or symptoms? The main symptoms of this condition are pain and a feeling of pressure around the affected sinuses. Other symptoms include: Stuffy nose or congestion. Thick drainage from your nose. Swelling and warmth over the affected sinuses. Headache. Upper toothache. A cough that may get worse at night. Extra mucus that collects in the throat or the back of the nose (postnasal drip). Decreased sense of smell and taste. Fatigue. A fever. Sore throat. Bad breath. How is this diagnosed? This condition is diagnosed based on: Your symptoms. Your medical history. A  physical exam. Tests to find out if your condition is acute or chronic. This may include: Checking your nose for nasal polyps. Viewing your sinuses using a device that has a light (endoscope). Testing for allergies or bacteria. Imaging tests, such as an MRI or CT scan. In rare cases, a bone biopsy may be done to rule out more serious types of fungal sinus disease. How is this treated? Treatment for sinusitis depends on the cause and whether your condition is chronic or acute. If caused by a virus, your symptoms should go away on their own within 10 days. You may be given medicines to relieve symptoms. They include: Medicines that shrink swollen nasal passages (topical intranasal decongestants). Medicines that treat allergies (antihistamines). A spray that eases inflammation of the nostrils (topical intranasal corticosteroids). Rinses that help get rid of thick mucus in your nose (nasal saline washes). If caused by bacteria, your health care provider may recommend waiting to see if your symptoms improve. Most bacterial infections will get better without antibiotic medicine. You may be given antibiotics if you have: A severe infection. A weak immune system. If caused by narrow nasal passages or nasal polyps, you may need to have surgery. Follow these instructions at home: Medicines  Take, use, or apply over-the-counter and prescription medicines only as told by your health care provider. These may include nasal sprays. If you were prescribed an antibiotic medicine, take it as told by your health care provider. Do not stop taking the antibiotic even if you start to feel better. Hydrate and humidify Drink enough fluid to keep your urine pale yellow. Staying hydrated will help to thin your mucus. Use a cool mist humidifier to keep the humidity level in your home above 50%. Inhale steam for 10-15 minutes, 3-4 times a day, or as told by your health care provider. You can do this in the bathroom  while a hot shower is running. Limit your exposure to cool or dry air.   Rest Rest as much as possible. Sleep with your head raised (elevated). Make sure you get enough sleep each night. General instructions Apply a warm, moist washcloth to your face 3-4 times a day or as told by your health care provider. This will help with discomfort. Wash your hands often with soap and water to reduce your exposure to germs. If soap and water are not available, use hand sanitizer. Do not smoke. Avoid being around people who are smoking (secondhand smoke). Keep all follow-up visits as told by your health care provider. This is important.   Contact a health care provider if: You have a fever. Your symptoms get worse. Your symptoms do not improve within 10 days. Get help right away if: You have a severe headache. You have persistent vomiting. You have severe pain or swelling around your face or eyes. You have vision problems. You develop confusion. Your neck is stiff. You have trouble breathing. Summary Sinusitis is soreness and inflammation of your sinuses. Sinuses are hollow spaces in the bones around your face. This condition is caused by nasal tissues that become inflamed or swollen. The swelling traps or blocks the flow of mucus. This allows bacteria, viruses, and fungi to grow, which leads to infection. If you were prescribed an antibiotic medicine, take it as told by your health care provider. Do not stop taking the antibiotic even if you start to feel better. Keep all follow-up visits as told by your health care provider. This is important. This information is not intended to replace advice given to you by your health care provider. Make sure you discuss any questions you have with your health care provider. Document Revised: 11/18/2017 Document Reviewed: 11/18/2017 Elsevier Patient Education  2021 [employer].

## 2020-12-08 NOTE — Progress Notes (Signed)
[name], Kristen Robertson are scheduled for a virtual visit with your provider today.    Just as we do with appointments in the office, we must obtain your consent to participate.  Your consent will be active for this visit and any virtual visit you may have with one of our providers in the next 365 days.    If you have a MyChart account, I can also send a copy of this consent to you electronically.  All virtual visits are billed to your insurance company just like a traditional visit in the office.  As this is a virtual visit, video technology does not allow for your provider to perform a traditional examination.  This may limit your provider's ability to fully assess your condition.  If your provider identifies any concerns that need to be evaluated in person or the need to arrange testing such as labs, EKG, etc, we will make arrangements to do so.    Although advances in technology are sophisticated, we cannot ensure that it will always work on either your end or our end.  If the connection with a video visit is poor, we may have to switch to a telephone visit.  With either a video or telephone visit, we are not always able to ensure that we have a secure connection.   I need to obtain your verbal consent now.   Are you willing to proceed with your visit today?   Kristen Robertson has provided verbal consent on 12/08/2020 for a virtual visit (video or telephone).   Mar Daring, PA-C 12/08/2020  3:38 PM    MyChart Video Visit    Virtual Visit via Video Note   This visit type was conducted due to national recommendations for restrictions regarding the COVID-19 Pandemic (e.g. social distancing) in an effort to limit this patient's exposure and mitigate transmission in our community. This patient is at least at moderate risk for complications without adequate follow up. This format is felt to be most appropriate for this patient at this time. Physical exam was limited by quality of the video and  audio technology used for the visit.   Patient location: Home Provider location: Home office in [place]  I discussed the limitations of evaluation and management by telemedicine and the availability of in person appointments. The patient expressed understanding and agreed to proceed.  Patient: Kristen Robertson   DOB: Jul 11, 1950   70 y.o. Female  MRN: [id number] Visit Date: 12/08/2020  Today's healthcare provider: Mar Daring, PA-C   No chief complaint on file.  Subjective    Otalgia  There is pain in the right ear. This is a new problem. The current episode started in the past 7 days. The problem has been gradually worsening. There has been no fever. The pain is moderate. Associated symptoms include coughing (mild, non-productive), headaches and rhinorrhea. Pertinent negatives include no ear discharge, sore throat or vomiting. She has tried acetaminophen and NSAIDs (zyrtec and benadryl) for the symptoms. The treatment provided no relief.   Had Covid 19 last week, tested positive on 12/02/20.   There are no problems to display for this patient.  Past Medical History:  Diagnosis Date   Glaucoma    IBS (irritable bowel syndrome)       Medications: Outpatient Medications Prior to Visit  Medication Sig   amoxicillin-clavulanate (AUGMENTIN) 875-125 MG tablet Take 1 tablet by mouth every 12 (twelve) hours.   benzonatate (TESSALON PERLES) 100 MG capsule Take 1 capsule (100  mg total) by mouth 3 (three) times daily as needed for cough.   imipramine (TOFRANIL) 50 MG tablet Take 50 mg by mouth at bedtime.   traMADol (ULTRAM) 50 MG tablet Take 1 tablet (50 mg total) by mouth every 6 (six) hours as needed.   No facility-administered medications prior to visit.    Review of Systems  Constitutional: Negative.  Negative for chills and fever.  HENT:  Positive for congestion, ear pain, postnasal drip, rhinorrhea, sinus pressure and sinus pain. Negative for ear discharge and sore  throat.   Respiratory:  Positive for cough (mild, non-productive). Negative for chest tightness and shortness of breath.   Cardiovascular: Negative.   Gastrointestinal:  Negative for vomiting.  Neurological:  Positive for headaches.   Last CBC Lab Results  Component Value Date   WBC 7.5 03/30/2020   HGB 14.1 03/30/2020   HCT 42.3 03/30/2020   MCV 95.1 03/30/2020   MCH 31.7 03/30/2020   RDW 12.1 03/30/2020   PLT 195 74/25/9563   Last metabolic panel Lab Results  Component Value Date   GLUCOSE 172 (H) 03/30/2020   NA 138 03/30/2020   K 4.4 03/30/2020   CL 107 03/30/2020   CO2 23 03/30/2020   BUN 15 03/30/2020   CREATININE 0.71 03/30/2020   GFRNONAA >60 03/30/2020   GFRAA >60 03/30/2020   CALCIUM 8.7 (L) 03/30/2020   ANIONGAP 8 03/30/2020      Objective    There were no vitals taken for this visit. BP Readings from Last 3 Encounters:  03/30/20 (!) 157/62  06/15/16 (!) 156/63  03/15/16 (!) 141/63   Wt Readings from Last 3 Encounters:  03/30/20 140 lb (63.5 kg)  06/15/16 150 lb (68 kg)  03/15/16 142 lb (64.4 kg)      Physical Exam Vitals reviewed.  Constitutional:      General: She is not in acute distress.    Appearance: Normal appearance. She is well-developed. She is not ill-appearing.  HENT:     Head: Normocephalic and atraumatic.  Pulmonary:     Effort: Pulmonary effort is normal. No respiratory distress.  Musculoskeletal:     Cervical back: Normal range of motion and neck supple.  Neurological:     Mental Status: She is alert.  Psychiatric:        Mood and Affect: Mood normal.        Behavior: Behavior normal.        Thought Content: Thought content normal.        Judgment: Judgment normal.       Assessment & Plan     1. Acute non-recurrent pansinusitis - Suspect secondary bacterial infection following recent Covid 19 -Worsening symptoms that have not responded to OTC medications.  - Will give augmentin as below.  - Continue allergy  medications.  - Stay well hydrated and get plenty of rest.  - Seek in-person evaluation if no symptom improvement or if symptoms worsen. - amoxicillin-clavulanate (AUGMENTIN) 875-125 MG tablet; Take 1 tablet by mouth 2 (two) times daily.  Dispense: 20 tablet; Refill: 0  2. Non-recurrent acute suppurative otitis media of right ear without spontaneous rupture of tympanic membrane - See above medical treatment plan.   No follow-ups on file.     I discussed the assessment and treatment plan with the patient. The patient was provided an opportunity to ask questions and all were answered. The patient agreed with the plan and demonstrated an understanding of the instructions.   The patient was advised  to call back or seek an in-person evaluation if the symptoms worsen or if the condition fails to improve as anticipated.  I provided 12 minutes of face-to-face time during this encounter via MyChart Video enabled encounter.   [place] [phone] (phone) [phone] (fax)  [place]

## 2021-10-25 IMAGING — CT CT HEAD W/O CM
3 series · 16 of 47 positions shown, 19 images · non-contrast
Comparison: None.

CLINICAL DATA: Dizziness.

EXAM:
CT HEAD WITHOUT CONTRAST
TECHNIQUE: Contiguous axial images were obtained from the base of the skull
through the vertex without intravenous contrast.

[Series 3: head wo · axial · 0.41mm/px · z∈[+321,+451]mm · 10 of 32 slices shown, 13 images]
[im 3/32  brain]
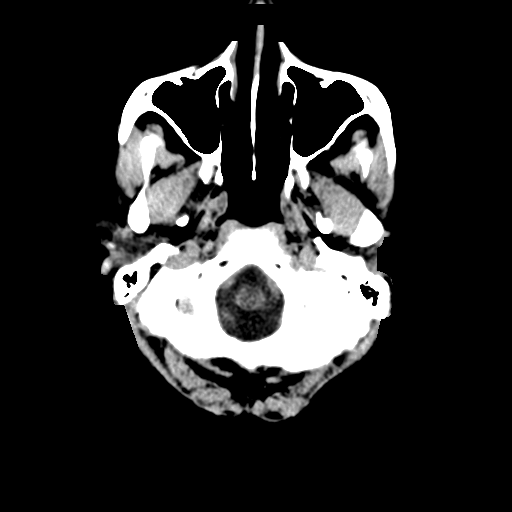
[im 3/32  bone]
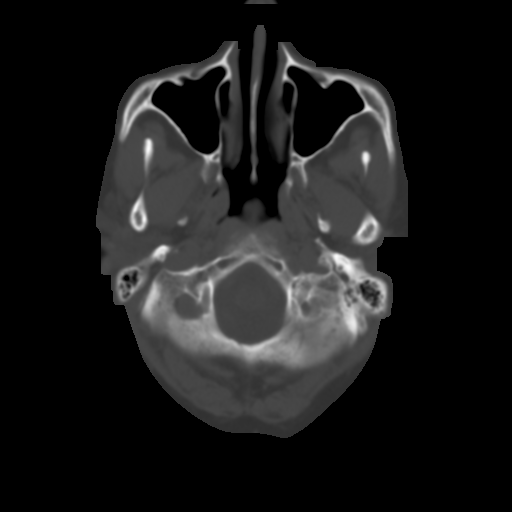
[im 6/32  brain]
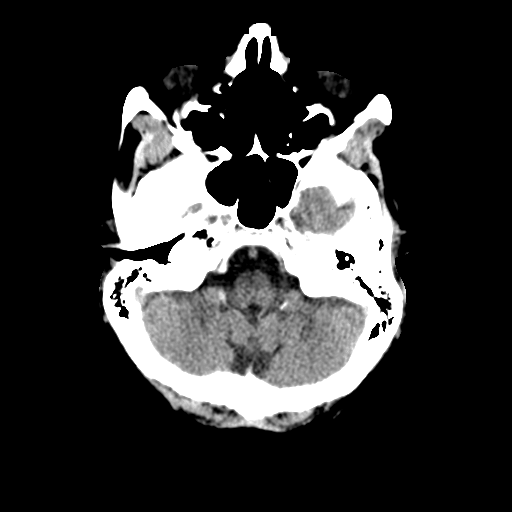
[im 9/32  brain]
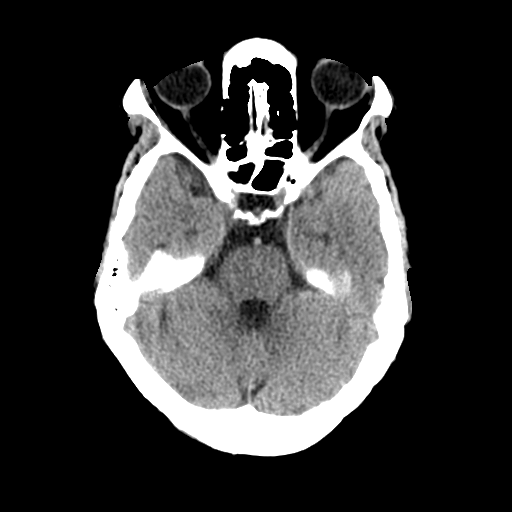
[im 11/32  brain]
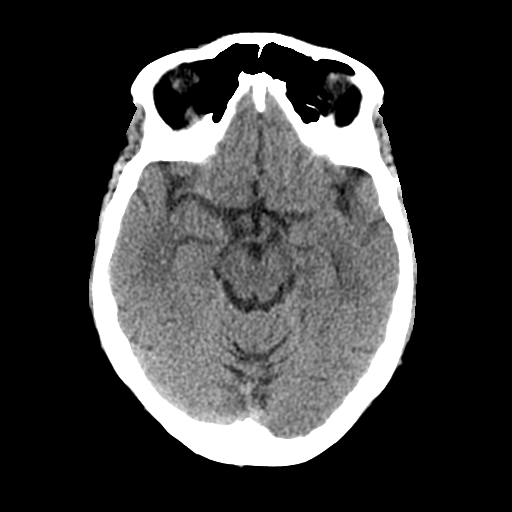
[im 14/32  brain]
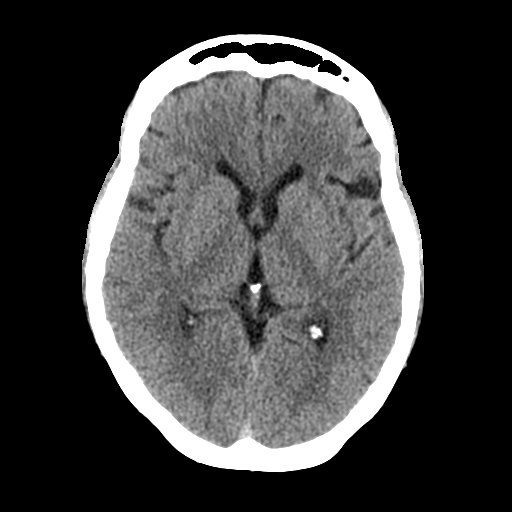
[im 14/32  bone]
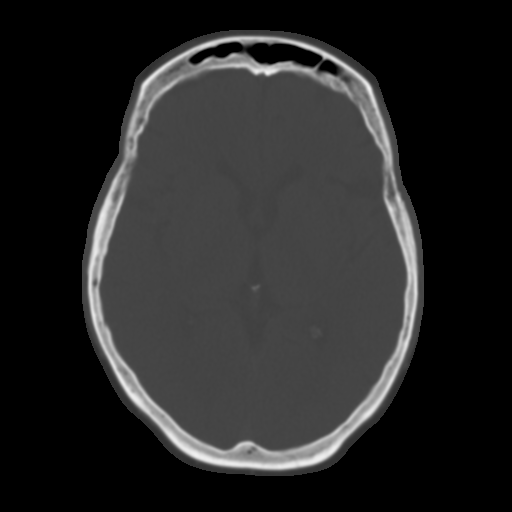
[im 18/32  brain]
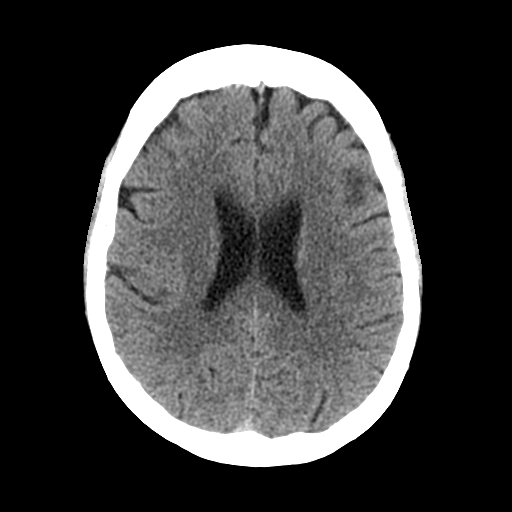
[im 21/32  brain]
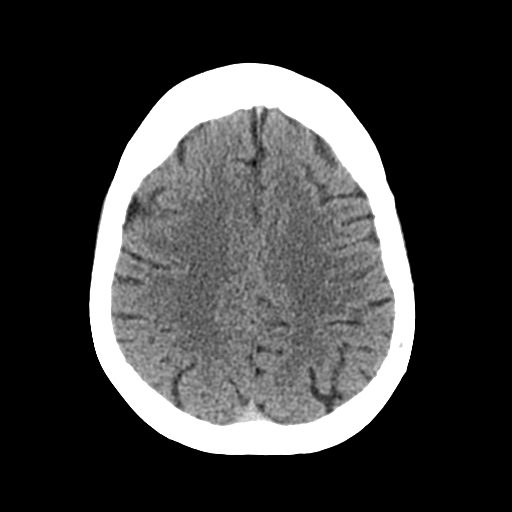
[im 24/32  brain]
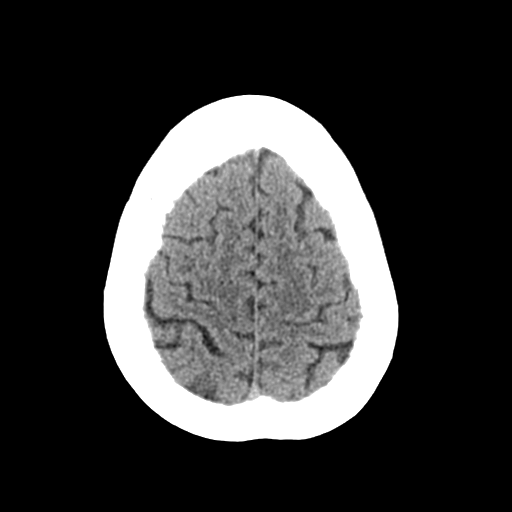
[im 26/32  brain]
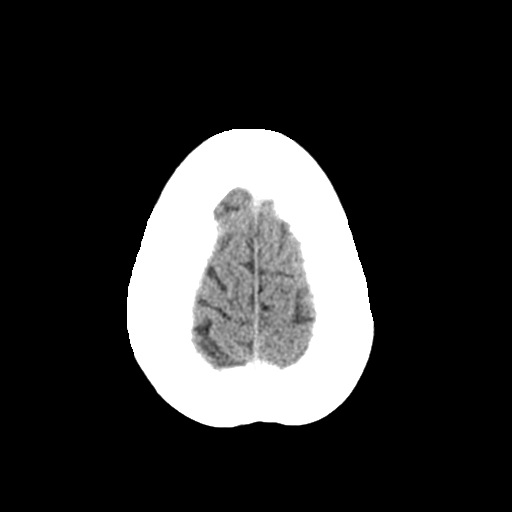
[im 26/32  bone]
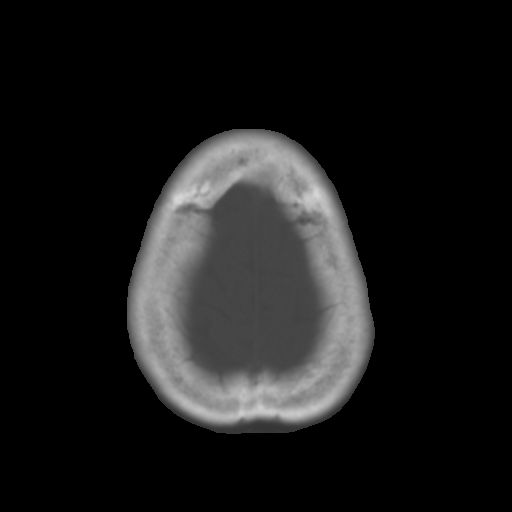
[im 29/32  brain]
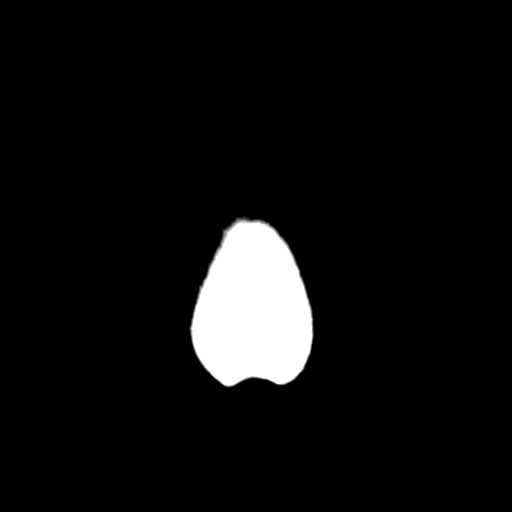

[Series 4: coronal soft tissue · coronal · 0.30mm/px · 3 of 65 slices shown]
[im 22/65  brain]
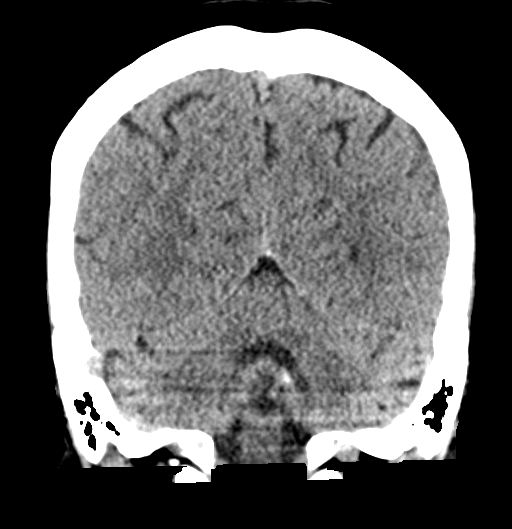
[im 29/65  brain]
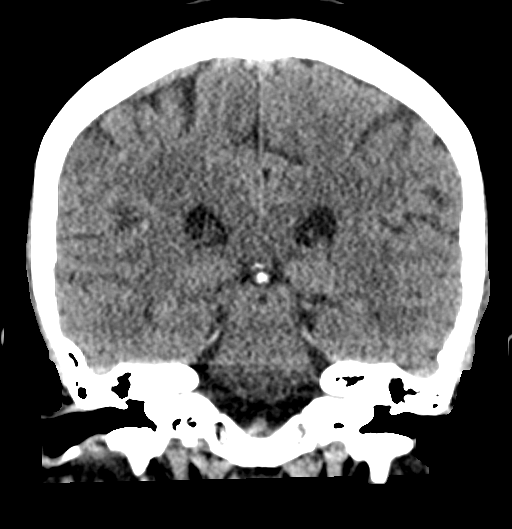
[im 36/65  brain]
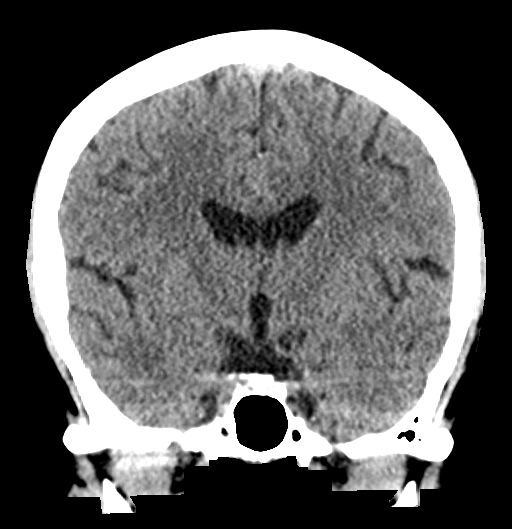

[Series 5: sagittal soft tissue · sagittal · 0.31mm/px · 3 of 52 slices shown]
[im 18/52  brain]
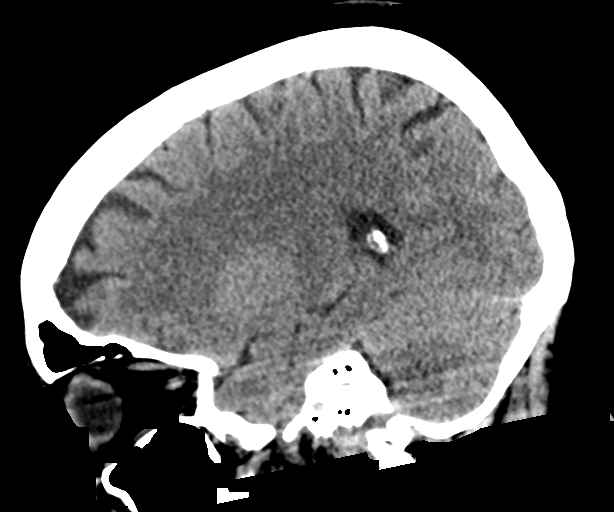
[im 26/52  brain]
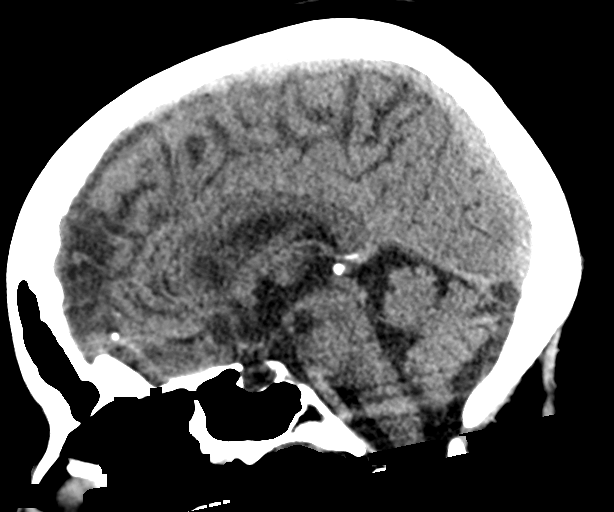
[im 35/52  brain]
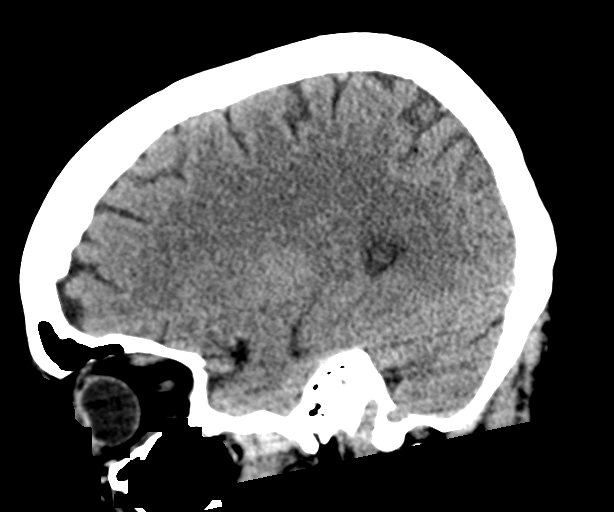

[16 of 47 positions shown; findings below may reference images not displayed]

FINDINGS: Brain: No evidence of acute large vascular territory infarction,
acute hemorrhage, hydrocephalus, extra-axial collection or mass
lesion/mass effect.

Vascular: No hyperdense vessel identified.

Skull: Normal. Negative for fracture or focal lesion.

Sinuses/Orbits: The visualized sinuses are clear. No acute orbital
abnormality.

Other: No mastoid or middle ear effusion.
IMPRESSION: No evidence of acute intracranial abnormality.
